# Patient Record
Sex: Male | Born: 1990 | Race: White | Hispanic: Yes | Marital: Single | Smoking: Current every day smoker
Health system: Southern US, Community
[De-identification: ages and names within clinical notes are randomized; demographics above are authoritative.]

## PROBLEM LIST (undated history)

## (undated) DIAGNOSIS — F419 Anxiety disorder, unspecified: Secondary | ICD-10-CM

---
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
## 2010-09-10 ENCOUNTER — Emergency Department (HOSPITAL_COMMUNITY): Admission: EM | Admit: 2010-09-10 | Discharge: 2010-07-11 | Payer: Self-pay | Admitting: Emergency Medicine

## 2017-01-23 ENCOUNTER — Inpatient Hospital Stay (HOSPITAL_COMMUNITY)
Admission: EM | Admit: 2017-01-23 | Discharge: 2017-01-25 | DRG: 958 | Disposition: A | Discharge status: Home / Self Care | Payer: Self-pay | Attending: General Surgery | Admitting: General Surgery

## 2017-01-23 ENCOUNTER — Encounter (HOSPITAL_COMMUNITY): Payer: Self-pay | Admitting: Emergency Medicine

## 2017-01-23 ENCOUNTER — Encounter (HOSPITAL_COMMUNITY): Admission: EM | Disposition: A | Discharge status: Home / Self Care | Payer: Self-pay | Source: Home / Self Care

## 2017-01-23 ENCOUNTER — Inpatient Hospital Stay (HOSPITAL_COMMUNITY): Payer: Self-pay | Admitting: Anesthesiology

## 2017-01-23 ENCOUNTER — Inpatient Hospital Stay (HOSPITAL_COMMUNITY): Payer: Self-pay

## 2017-01-23 ENCOUNTER — Emergency Department (HOSPITAL_COMMUNITY): Payer: Self-pay

## 2017-01-23 DIAGNOSIS — S82201B Unspecified fracture of shaft of right tibia, initial encounter for open fracture type I or II: Secondary | ICD-10-CM

## 2017-01-23 DIAGNOSIS — S82251B Displaced comminuted fracture of shaft of right tibia, initial encounter for open fracture type I or II: Principal | ICD-10-CM | POA: Diagnosis present

## 2017-01-23 DIAGNOSIS — S52201A Unspecified fracture of shaft of right ulna, initial encounter for closed fracture: Secondary | ICD-10-CM | POA: Diagnosis present

## 2017-01-23 DIAGNOSIS — Y92410 Unspecified street and highway as the place of occurrence of the external cause: Secondary | ICD-10-CM

## 2017-01-23 DIAGNOSIS — S301XXA Contusion of abdominal wall, initial encounter: Secondary | ICD-10-CM | POA: Diagnosis present

## 2017-01-23 DIAGNOSIS — S82401B Unspecified fracture of shaft of right fibula, initial encounter for open fracture type I or II: Secondary | ICD-10-CM | POA: Diagnosis present

## 2017-01-23 DIAGNOSIS — T148XXA Other injury of unspecified body region, initial encounter: Secondary | ICD-10-CM

## 2017-01-23 DIAGNOSIS — F17210 Nicotine dependence, cigarettes, uncomplicated: Secondary | ICD-10-CM | POA: Diagnosis present

## 2017-01-23 DIAGNOSIS — Y9301 Activity, walking, marching and hiking: Secondary | ICD-10-CM | POA: Diagnosis present

## 2017-01-23 DIAGNOSIS — S52601A Unspecified fracture of lower end of right ulna, initial encounter for closed fracture: Secondary | ICD-10-CM | POA: Diagnosis present

## 2017-01-23 DIAGNOSIS — S27321A Contusion of lung, unilateral, initial encounter: Secondary | ICD-10-CM | POA: Diagnosis present

## 2017-01-23 HISTORY — PX: ORIF ULNAR FRACTURE: SHX5417

## 2017-01-23 HISTORY — DX: Anxiety disorder, unspecified: F41.9

## 2017-01-23 HISTORY — PX: TIBIA IM NAIL INSERTION: SHX2516

## 2017-01-23 LAB — URINALYSIS, ROUTINE W REFLEX MICROSCOPIC
BACTERIA UA: NONE SEEN
Bilirubin Urine: NEGATIVE
GLUCOSE, UA: NEGATIVE mg/dL
KETONES UR: NEGATIVE mg/dL
Leukocytes, UA: NEGATIVE
Nitrite: NEGATIVE
PROTEIN: 30 mg/dL — AB
Specific Gravity, Urine: 1.019 (ref 1.005–1.030)
pH: 5 (ref 5.0–8.0)

## 2017-01-23 LAB — COMPREHENSIVE METABOLIC PANEL
ALT: 27 U/L (ref 17–63)
ANION GAP: 15 (ref 5–15)
AST: 44 U/L — ABNORMAL HIGH (ref 15–41)
Albumin: 3.8 g/dL (ref 3.5–5.0)
Alkaline Phosphatase: 64 U/L (ref 38–126)
BUN: 17 mg/dL (ref 6–20)
CO2: 21 mmol/L — AB (ref 22–32)
Calcium: 9.1 mg/dL (ref 8.9–10.3)
Chloride: 100 mmol/L — ABNORMAL LOW (ref 101–111)
Creatinine, Ser: 0.95 mg/dL (ref 0.61–1.24)
GFR calc Af Amer: >60 mL/min (ref >60)
GFR calc non Af Amer: >60 mL/min (ref >60)
GLUCOSE: 88 mg/dL (ref 65–99)
POTASSIUM: 4 mmol/L (ref 3.5–5.1)
SODIUM: 136 mmol/L (ref 135–145)
TOTAL PROTEIN: 6 g/dL — AB (ref 6.5–8.1)
Total Bilirubin: 0.7 mg/dL (ref 0.3–1.2)

## 2017-01-23 LAB — I-STAT CHEM 8, ED
BUN: 24 mg/dL — ABNORMAL HIGH (ref 6–20)
Calcium, Ion: 1.06 mmol/L — ABNORMAL LOW (ref 1.15–1.40)
Chloride: 101 mmol/L (ref 101–111)
Creatinine, Ser: 1.1 mg/dL (ref 0.61–1.24)
Glucose, Bld: 93 mg/dL (ref 65–99)
HCT: 42 % (ref 39.0–52.0)
Hemoglobin: 14.3 g/dL (ref 13.0–17.0)
Potassium: 4 mmol/L (ref 3.5–5.1)
SODIUM: 136 mmol/L (ref 135–145)
TCO2: 24 mmol/L (ref 0–100)

## 2017-01-23 LAB — I-STAT CG4 LACTIC ACID, ED: LACTIC ACID, VENOUS: 1.8 mmol/L (ref 0.5–1.9)

## 2017-01-23 LAB — BLOOD GAS, ARTERIAL
Acid-base deficit: 1.3 mmol/L (ref 0.0–2.0)
Bicarbonate: 25.1 mmol/L (ref 20.0–28.0)
Drawn by: 244851
FIO2: 100
O2 Saturation: 99.5 %
PCO2 ART: 60 mmHg — AB (ref 32.0–48.0)
PH ART: 7.245 — AB (ref 7.350–7.450)
Patient temperature: 98.6
pO2, Arterial: 285 mmHg — ABNORMAL HIGH (ref 83.0–108.0)

## 2017-01-23 LAB — RAPID URINE DRUG SCREEN, HOSP PERFORMED
Amphetamines: NOT DETECTED
BARBITURATES: NOT DETECTED
Benzodiazepines: POSITIVE — AB
COCAINE: POSITIVE — AB
OPIATES: POSITIVE — AB
TETRAHYDROCANNABINOL: NOT DETECTED

## 2017-01-23 LAB — CBC
HEMATOCRIT: 41.1 % (ref 39.0–52.0)
Hemoglobin: 14.1 g/dL (ref 13.0–17.0)
MCH: 31.4 pg (ref 26.0–34.0)
MCHC: 34.3 g/dL (ref 30.0–36.0)
MCV: 91.5 fL (ref 78.0–100.0)
Platelets: 253 10*3/uL (ref 150–400)
RBC: 4.49 MIL/uL (ref 4.22–5.81)
RDW: 14.1 % (ref 11.5–15.5)
WBC: 10.8 10*3/uL — ABNORMAL HIGH (ref 4.0–10.5)

## 2017-01-23 LAB — ETHANOL: Alcohol, Ethyl (B): <5 mg/dL (ref <5)

## 2017-01-23 LAB — HIV ANTIBODY (ROUTINE TESTING W REFLEX): HIV SCREEN 4TH GENERATION: NONREACTIVE

## 2017-01-23 LAB — SAMPLE TO BLOOD BANK

## 2017-01-23 LAB — CDS SEROLOGY

## 2017-01-23 LAB — PROTIME-INR
INR: 1.09
Prothrombin Time: 14.2 seconds (ref 11.4–15.2)

## 2017-01-23 LAB — MRSA PCR SCREENING: MRSA by PCR: NEGATIVE

## 2017-01-23 SURGERY — INSERTION, INTRAMEDULLARY ROD, TIBIA
Anesthesia: General | Site: Leg Lower | Laterality: Right

## 2017-01-23 MED ORDER — DEXTROSE 5 % IV SOLN
500.0000 mg | Freq: Four times a day (QID) | INTRAVENOUS | Status: DC | PRN
  Filled 2017-01-23: qty 5

## 2017-01-23 MED ORDER — ONDANSETRON HCL 4 MG/2ML IJ SOLN: 4.0000 mg | Freq: Four times a day (QID) | INTRAMUSCULAR | Status: DC | PRN

## 2017-01-23 MED ORDER — CEFAZOLIN SODIUM-DEXTROSE 2-4 GM/100ML-% IV SOLN
2.0000 g | Freq: Once | INTRAVENOUS | Status: AC
  Administered 2017-01-23: 2 g via INTRAVENOUS
  Filled 2017-01-23: qty 100

## 2017-01-23 MED ORDER — ADULT MULTIVITAMIN W/MINERALS CH
1.0000 | ORAL_TABLET | Freq: Every day | ORAL | Status: DC
  Administered 2017-01-24 – 2017-01-25 (×2): 1 via ORAL
  Filled 2017-01-23 (×3): qty 1

## 2017-01-23 MED ORDER — ACETAMINOPHEN 325 MG PO TABS
650.0000 mg | ORAL_TABLET | Freq: Four times a day (QID) | ORAL | Status: DC | PRN
  Administered 2017-01-24 (×2): 650 mg via ORAL
  Filled 2017-01-23 (×2): qty 2

## 2017-01-23 MED ORDER — BUPIVACAINE HCL (PF) 0.5 % IJ SOLN
INTRAMUSCULAR | Status: AC
  Filled 2017-01-23: qty 30

## 2017-01-23 MED ORDER — PROMETHAZINE HCL 25 MG/ML IJ SOLN: 6.2500 mg | INTRAMUSCULAR | Status: DC | PRN

## 2017-01-23 MED ORDER — LIDOCAINE 2% (20 MG/ML) 5 ML SYRINGE
INTRAMUSCULAR | Status: AC
  Filled 2017-01-23: qty 5

## 2017-01-23 MED ORDER — SUGAMMADEX SODIUM 200 MG/2ML IV SOLN
INTRAVENOUS | Status: DC | PRN
  Administered 2017-01-23: 200 mg via INTRAVENOUS

## 2017-01-23 MED ORDER — DEXAMETHASONE SODIUM PHOSPHATE 10 MG/ML IJ SOLN
INTRAMUSCULAR | Status: AC
  Filled 2017-01-23: qty 1

## 2017-01-23 MED ORDER — PROPOFOL 10 MG/ML IV BOLUS
INTRAVENOUS | Status: DC | PRN
  Administered 2017-01-23: 200 mg via INTRAVENOUS

## 2017-01-23 MED ORDER — MIDAZOLAM HCL 2 MG/2ML IJ SOLN
INTRAMUSCULAR | Status: AC
  Filled 2017-01-23: qty 2

## 2017-01-23 MED ORDER — CEFAZOLIN SODIUM 1 G IJ SOLR
INTRAMUSCULAR | Status: AC
  Filled 2017-01-23: qty 20

## 2017-01-23 MED ORDER — KETOROLAC TROMETHAMINE 30 MG/ML IJ SOLN
30.0000 mg | Freq: Four times a day (QID) | INTRAMUSCULAR | Status: DC
  Administered 2017-01-23 – 2017-01-25 (×8): 30 mg via INTRAVENOUS
  Filled 2017-01-23 (×8): qty 1

## 2017-01-23 MED ORDER — FENTANYL CITRATE (PF) 100 MCG/2ML IJ SOLN
50.0000 ug | Freq: Once | INTRAMUSCULAR | Status: AC
  Administered 2017-01-23: 50 ug via INTRAVENOUS
  Filled 2017-01-23: qty 2

## 2017-01-23 MED ORDER — ROCURONIUM BROMIDE 100 MG/10ML IV SOLN
INTRAVENOUS | Status: DC | PRN
  Administered 2017-01-23: 60 mg via INTRAVENOUS
  Administered 2017-01-23 (×2): 20 mg via INTRAVENOUS

## 2017-01-23 MED ORDER — SODIUM CHLORIDE 0.9 % IV SOLN
INTRAVENOUS | Status: DC
  Administered 2017-01-23 (×2): via INTRAVENOUS

## 2017-01-23 MED ORDER — ROCURONIUM BROMIDE 10 MG/ML (PF) SYRINGE
PREFILLED_SYRINGE | INTRAVENOUS | Status: AC
  Filled 2017-01-23: qty 5

## 2017-01-23 MED ORDER — METOCLOPRAMIDE HCL 5 MG PO TABS: 5.0000 mg | ORAL_TABLET | Freq: Three times a day (TID) | ORAL | Status: DC | PRN

## 2017-01-23 MED ORDER — FENTANYL CITRATE (PF) 100 MCG/2ML IJ SOLN
INTRAMUSCULAR | Status: AC | PRN
  Administered 2017-01-23: 50 ug via INTRAVENOUS

## 2017-01-23 MED ORDER — CEFAZOLIN SODIUM-DEXTROSE 2-3 GM-% IV SOLR
INTRAVENOUS | Status: DC | PRN
  Administered 2017-01-23: 2 g via INTRAVENOUS

## 2017-01-23 MED ORDER — SUGAMMADEX SODIUM 200 MG/2ML IV SOLN
INTRAVENOUS | Status: AC
  Filled 2017-01-23: qty 2

## 2017-01-23 MED ORDER — HYDROMORPHONE HCL 1 MG/ML IJ SOLN: 0.2500 mg | INTRAMUSCULAR | Status: DC | PRN

## 2017-01-23 MED ORDER — LIDOCAINE HCL (CARDIAC) 20 MG/ML IV SOLN
INTRAVENOUS | Status: DC | PRN
  Administered 2017-01-23: 100 mg via INTRAVENOUS

## 2017-01-23 MED ORDER — LORAZEPAM 1 MG PO TABS: 1.0000 mg | ORAL_TABLET | Freq: Four times a day (QID) | ORAL | Status: DC | PRN

## 2017-01-23 MED ORDER — 0.9 % SODIUM CHLORIDE (POUR BTL) OPTIME
TOPICAL | Status: DC | PRN
  Administered 2017-01-23: 1000 mL

## 2017-01-23 MED ORDER — ONDANSETRON HCL 4 MG/2ML IJ SOLN
INTRAMUSCULAR | Status: DC | PRN
  Administered 2017-01-23: 4 mg via INTRAVENOUS

## 2017-01-23 MED ORDER — ARTIFICIAL TEARS OP OINT
TOPICAL_OINTMENT | OPHTHALMIC | Status: DC | PRN
  Administered 2017-01-23: 1 via OPHTHALMIC

## 2017-01-23 MED ORDER — PROPOFOL 10 MG/ML IV BOLUS
INTRAVENOUS | Status: AC
  Filled 2017-01-23: qty 40

## 2017-01-23 MED ORDER — ONDANSETRON HCL 4 MG PO TABS: 4.0000 mg | ORAL_TABLET | Freq: Four times a day (QID) | ORAL | Status: DC | PRN

## 2017-01-23 MED ORDER — BUPIVACAINE HCL (PF) 0.25 % IJ SOLN
INTRAMUSCULAR | Status: AC
  Filled 2017-01-23: qty 30

## 2017-01-23 MED ORDER — DEXTROSE 5 % IV SOLN
INTRAVENOUS | Status: DC | PRN
  Administered 2017-01-23: 10:00:00 via INTRAVENOUS

## 2017-01-23 MED ORDER — FENTANYL CITRATE (PF) 100 MCG/2ML IJ SOLN
INTRAMUSCULAR | Status: DC | PRN
  Administered 2017-01-23 (×2): 50 ug via INTRAVENOUS
  Administered 2017-01-23: 100 ug via INTRAVENOUS
  Administered 2017-01-23: 50 ug via INTRAVENOUS

## 2017-01-23 MED ORDER — ARTIFICIAL TEARS OP OINT
TOPICAL_OINTMENT | OPHTHALMIC | Status: AC
  Filled 2017-01-23: qty 3.5

## 2017-01-23 MED ORDER — TETANUS-DIPHTH-ACELL PERTUSSIS 5-2.5-18.5 LF-MCG/0.5 IM SUSP
0.5000 mL | Freq: Once | INTRAMUSCULAR | Status: AC
  Administered 2017-01-23: 0.5 mL via INTRAMUSCULAR
  Filled 2017-01-23: qty 0.5

## 2017-01-23 MED ORDER — SODIUM CHLORIDE 0.9 % IV BOLUS (SEPSIS)
1000.0000 mL | Freq: Once | INTRAVENOUS | Status: AC
  Administered 2017-01-23: 1000 mL via INTRAVENOUS

## 2017-01-23 MED ORDER — LACTATED RINGERS IV SOLN
INTRAVENOUS | Status: DC | PRN
  Administered 2017-01-23: 10:00:00 via INTRAVENOUS

## 2017-01-23 MED ORDER — CEFAZOLIN SODIUM-DEXTROSE 2-4 GM/100ML-% IV SOLN
2.0000 g | Freq: Four times a day (QID) | INTRAVENOUS | Status: DC
  Administered 2017-01-23 – 2017-01-24 (×2): 2 g via INTRAVENOUS
  Filled 2017-01-23 (×2): qty 100

## 2017-01-23 MED ORDER — LORAZEPAM 1 MG PO TABS
0.0000 mg | ORAL_TABLET | Freq: Four times a day (QID) | ORAL | Status: AC
  Administered 2017-01-23: 4 mg via ORAL
  Filled 2017-01-23 (×2): qty 4

## 2017-01-23 MED ORDER — FOLIC ACID 1 MG PO TABS
1.0000 mg | ORAL_TABLET | Freq: Every day | ORAL | Status: DC
  Administered 2017-01-24 – 2017-01-25 (×2): 1 mg via ORAL
  Filled 2017-01-23 (×3): qty 1

## 2017-01-23 MED ORDER — IOPAMIDOL (ISOVUE-300) INJECTION 61%
INTRAVENOUS | Status: AC
  Administered 2017-01-23: 100 mL
  Filled 2017-01-23: qty 100

## 2017-01-23 MED ORDER — HYDROMORPHONE HCL 1 MG/ML IJ SOLN
1.0000 mg | INTRAMUSCULAR | Status: DC | PRN
  Administered 2017-01-23 (×2): 1 mg via INTRAVENOUS
  Filled 2017-01-23 (×2): qty 1

## 2017-01-23 MED ORDER — CEFAZOLIN SODIUM-DEXTROSE 2-4 GM/100ML-% IV SOLN
INTRAVENOUS | Status: AC
  Administered 2017-01-23: 2 g via INTRAVENOUS
  Filled 2017-01-23: qty 100

## 2017-01-23 MED ORDER — FENTANYL CITRATE (PF) 100 MCG/2ML IJ SOLN
INTRAMUSCULAR | Status: AC
  Filled 2017-01-23: qty 2

## 2017-01-23 MED ORDER — METOCLOPRAMIDE HCL 5 MG/ML IJ SOLN: 5.0000 mg | Freq: Three times a day (TID) | INTRAMUSCULAR | Status: DC | PRN

## 2017-01-23 MED ORDER — LORAZEPAM 2 MG/ML IJ SOLN: 1.0000 mg | Freq: Four times a day (QID) | INTRAMUSCULAR | Status: DC | PRN

## 2017-01-23 MED ORDER — ACETAMINOPHEN 650 MG RE SUPP: 650.0000 mg | Freq: Four times a day (QID) | RECTAL | Status: DC | PRN

## 2017-01-23 MED ORDER — BUPIVACAINE HCL (PF) 0.5 % IJ SOLN
INTRAMUSCULAR | Status: DC | PRN
  Administered 2017-01-23: 10 mL

## 2017-01-23 MED ORDER — ENOXAPARIN SODIUM 40 MG/0.4ML ~~LOC~~ SOLN
40.0000 mg | SUBCUTANEOUS | Status: DC
  Administered 2017-01-24 – 2017-01-25 (×2): 40 mg via SUBCUTANEOUS
  Filled 2017-01-23 (×3): qty 0.4

## 2017-01-23 MED ORDER — DEXAMETHASONE SODIUM PHOSPHATE 10 MG/ML IJ SOLN
INTRAMUSCULAR | Status: DC | PRN
  Administered 2017-01-23: 10 mg via INTRAVENOUS

## 2017-01-23 MED ORDER — MIDAZOLAM HCL 5 MG/5ML IJ SOLN
INTRAMUSCULAR | Status: DC | PRN
  Administered 2017-01-23: 2 mg via INTRAVENOUS

## 2017-01-23 MED ORDER — FENTANYL CITRATE (PF) 250 MCG/5ML IJ SOLN
INTRAMUSCULAR | Status: AC
  Filled 2017-01-23: qty 5

## 2017-01-23 MED ORDER — METHOCARBAMOL 500 MG PO TABS: 500.0000 mg | ORAL_TABLET | Freq: Four times a day (QID) | ORAL | Status: DC | PRN

## 2017-01-23 MED ORDER — LORAZEPAM 1 MG PO TABS: 0.0000 mg | ORAL_TABLET | Freq: Two times a day (BID) | ORAL | Status: DC

## 2017-01-23 MED ORDER — ONDANSETRON HCL 4 MG/2ML IJ SOLN
INTRAMUSCULAR | Status: AC
  Filled 2017-01-23: qty 2

## 2017-01-23 SURGICAL SUPPLY — 77 items
BANDAGE ACE 4X5 VEL STRL LF (GAUZE/BANDAGES/DRESSINGS) ×8 IMPLANT
BANDAGE ACE 6X5 VEL STRL LF (GAUZE/BANDAGES/DRESSINGS) ×8 IMPLANT
BANDAGE ELASTIC 4 VELCRO ST LF (GAUZE/BANDAGES/DRESSINGS) ×4 IMPLANT
BANDAGE ELASTIC 6 VELCRO ST LF (GAUZE/BANDAGES/DRESSINGS) ×4 IMPLANT
BANDAGE ESMARK 6X9 LF (GAUZE/BANDAGES/DRESSINGS) ×2 IMPLANT
BIT DRILL 2.0 (BIT) ×4 IMPLANT
BIT DRILL AO GAMMA 4.2X130 (BIT) ×4 IMPLANT
BIT DRILL AO GAMMA 4.2X180 (BIT) ×4 IMPLANT
BIT DRILL AO GAMMA 4.2X340 (BIT) ×8 IMPLANT
BNDG COHESIVE 4X5 TAN STRL (GAUZE/BANDAGES/DRESSINGS) ×4 IMPLANT
BNDG ESMARK 6X9 LF (GAUZE/BANDAGES/DRESSINGS) ×4
BNDG GAUZE ELAST 4 BULKY (GAUZE/BANDAGES/DRESSINGS) ×4 IMPLANT
COVER MAYO STAND STRL (DRAPES) ×8 IMPLANT
COVER SURGICAL LIGHT HANDLE (MISCELLANEOUS) ×4 IMPLANT
CUFF TOURNIQUET SINGLE 34IN LL (TOURNIQUET CUFF) ×4 IMPLANT
DRAPE C-ARM 42X72 X-RAY (DRAPES) ×4 IMPLANT
DRAPE C-ARMOR (DRAPES) ×4 IMPLANT
DRAPE HALF SHEET 40X57 (DRAPES) ×4 IMPLANT
DRAPE IMP U-DRAPE 54X76 (DRAPES) ×4 IMPLANT
DRAPE POUCH INSTRU U-SHP 10X18 (DRAPES) ×4 IMPLANT
DRAPE U-SHAPE 47X51 STRL (DRAPES) ×4 IMPLANT
DRAPE UTILITY XL STRL (DRAPES) ×8 IMPLANT
DRESSING ADAPTIC 1/2  N-ADH (PACKING) ×4 IMPLANT
DRSG PAD ABDOMINAL 8X10 ST (GAUZE/BANDAGES/DRESSINGS) ×8 IMPLANT
DURAPREP 26ML APPLICATOR (WOUND CARE) ×4 IMPLANT
ELECT CAUTERY BLADE 6.4 (BLADE) ×4 IMPLANT
ELECT REM PT RETURN 9FT ADLT (ELECTROSURGICAL) ×4
ELECTRODE REM PT RTRN 9FT ADLT (ELECTROSURGICAL) ×2 IMPLANT
FACESHIELD WRAPAROUND (MASK) ×8 IMPLANT
GAUZE SPONGE 4X4 12PLY STRL (GAUZE/BANDAGES/DRESSINGS) ×8 IMPLANT
GAUZE SPONGE 4X4 12PLY STRL LF (GAUZE/BANDAGES/DRESSINGS) ×8 IMPLANT
GAUZE XEROFORM 1X8 LF (GAUZE/BANDAGES/DRESSINGS) ×4 IMPLANT
GAUZE XEROFORM 5X9 LF (GAUZE/BANDAGES/DRESSINGS) ×4 IMPLANT
GLOVE BIO SURGEON STRL SZ7.5 (GLOVE) ×4 IMPLANT
GLOVE BIOGEL PI IND STRL 8 (GLOVE) ×2 IMPLANT
GLOVE BIOGEL PI INDICATOR 8 (GLOVE) ×2
GOWN STRL REUS W/ TWL LRG LVL3 (GOWN DISPOSABLE) ×4 IMPLANT
GOWN STRL REUS W/ TWL XL LVL3 (GOWN DISPOSABLE) ×4 IMPLANT
GOWN STRL REUS W/TWL LRG LVL3 (GOWN DISPOSABLE) ×4
GOWN STRL REUS W/TWL XL LVL3 (GOWN DISPOSABLE) ×4
GUIDEROD T2 3X1000 (ROD) ×4 IMPLANT
GUIDEWIRE GAMMA (WIRE) ×12 IMPLANT
K-WIRE FIXATION 3X285 COATED (WIRE) ×8
KIT BASIN OR (CUSTOM PROCEDURE TRAY) ×4 IMPLANT
KWIRE FIXATION 3X285 COATED (WIRE) ×4 IMPLANT
MANIFOLD NEPTUNE II (INSTRUMENTS) ×4 IMPLANT
NAIL ELAS INSERT SLV SPI 8-11 (MISCELLANEOUS) ×4 IMPLANT
NAIL TIBIA STD 9X330MM (Nail) ×4 IMPLANT
NS IRRIG 1000ML POUR BTL (IV SOLUTION) ×4 IMPLANT
PACK TOTAL JOINT (CUSTOM PROCEDURE TRAY) ×4 IMPLANT
PACK UNIVERSAL I (CUSTOM PROCEDURE TRAY) ×8 IMPLANT
PAD CAST 4YDX4 CTTN HI CHSV (CAST SUPPLIES) ×4 IMPLANT
PADDING CAST COTTON 4X4 STRL (CAST SUPPLIES) ×4
PADDING CAST COTTON 6X4 STRL (CAST SUPPLIES) ×4 IMPLANT
PLATE COMP NARROW STRT 7H/90MM (Plate) ×4 IMPLANT
REAMER INTRAMEDULLARY 8MM 510 (MISCELLANEOUS) ×3 IMPLANT
SCREW BONE 2.7X16MM (Screw) ×4 IMPLANT
SCREW BONE 2.7X18MM (Screw) ×4 IMPLANT
SCREW LOCKING 2.7X14MM (Screw) ×8 IMPLANT
SCREW LOCKING 2.7X16MM (Screw) ×8 IMPLANT
SCREW LOCKING T2 F/T  5MMX35MM (Screw) ×6 IMPLANT
SCREW LOCKING T2 F/T  5MMX65MM (Screw) ×2 IMPLANT
SCREW LOCKING T2 F/T  5X42.5MM (Screw) ×4 IMPLANT
SCREW LOCKING T2 F/T 5MMX35MM (Screw) ×6 IMPLANT
SCREW LOCKING T2 F/T 5MMX65MM (Screw) ×2 IMPLANT
SCREW LOCKING T2 F/T 5X42.5MM (Screw) ×4 IMPLANT
SPLINT PLASTER EXTRA FAST 3X15 (CAST SUPPLIES) ×2
SPLINT PLASTER GYPS XFAST 3X15 (CAST SUPPLIES) ×2 IMPLANT
STAPLER SKIN PROX WIDE 3.9 (STAPLE) ×4 IMPLANT
SUT ETHILON 3 0 PS 1 (SUTURE) ×8 IMPLANT
SUT MON AB 2-0 CT1 36 (SUTURE) ×4 IMPLANT
SUT VIC AB 0 CT1 27 (SUTURE) ×2
SUT VIC AB 0 CT1 27XBRD ANBCTR (SUTURE) ×2 IMPLANT
SUT VIC AB 2-0 FS1 27 (SUTURE) ×4 IMPLANT
SUT VICRYL RAPIDE 4/0 PS 2 (SUTURE) ×4 IMPLANT
TOWEL OR 17X26 10 PK STRL BLUE (TOWEL DISPOSABLE) ×16 IMPLANT
WATER STERILE IRR 1000ML POUR (IV SOLUTION) ×4 IMPLANT

## 2017-01-23 NOTE — Anesthesia Procedure Notes (Signed)
Procedure Name: Intubation Date/Time: 01/23/2017 9:46 AM Performed by: Jacquiline Doe A Pre-anesthesia Checklist: Patient identified, Emergency Drugs available, Suction available and Patient being monitored Patient Re-evaluated:Patient Re-evaluated prior to inductionOxygen Delivery Method: Circle System Utilized and Circle system utilized Preoxygenation: Pre-oxygenation with 100% oxygen Intubation Type: IV induction and Cricoid Pressure applied Ventilation: Mask ventilation without difficulty Laryngoscope Size: Mac and 4 Grade View: Grade I Tube type: Oral Tube size: 7.5 mm Number of attempts: 1 Airway Equipment and Method: Stylet Placement Confirmation: ETT inserted through vocal cords under direct vision,  positive ETCO2 and breath sounds checked- equal and bilateral Secured at: 23 cm Tube secured with: Tape Dental Injury: Teeth and Oropharynx as per pre-operative assessment
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
## 2017-01-23 NOTE — Progress Notes (Signed)
Orthopedic Tech Progress Note Patient Details:  James Perkins 02-08-1991 [id number]  Ortho Devices Type of Ortho Device: CAM walker Ortho Device/Splint Location: [place] Ortho Device/Splint Interventions: Application   [place] Pink 01/23/2017, 2:37 PM

## 2017-01-23 NOTE — ED Notes (Signed)
Pt has phone, passport, necklace at bedside

## 2017-01-23 NOTE — Transfer of Care (Signed)
Immediate Anesthesia Transfer of Care Note  Patient: James Perkins  Procedure(s) Performed: Procedure(s): INTRAMEDULLARY (IM) NAIL TIBIAL (Right) OPEN REDUCTION INTERNAL FIXATION (ORIF) ULNAR FRACTURE (Right)  Patient Location: PACU  Anesthesia Type:General  Level of Consciousness: awake, sedated, patient cooperative and responds to stimulation  Airway & Oxygen Therapy: Patient Spontanous Breathing and Patient connected to nasal cannula oxygen  Post-op Assessment: Report given to RN, Post -op Vital signs reviewed and stable, Patient moving all extremities and Patient moving all extremities X 4  Post vital signs: Reviewed and stable  Last Vitals:  Vitals:   01/23/17 0600 01/23/17 0626  BP: (!) 124/49 (!) 132/59  Pulse: 97 90  Resp: (!) 23   Temp:  37 C    Last Pain:  Vitals:   01/23/17 0703  TempSrc:   PainSc: 3          Complications: No apparent anesthesia complications
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
## 2017-01-23 NOTE — ED Notes (Signed)
Pt transported to CT ?

## 2017-01-23 NOTE — Progress Notes (Signed)
Orthopedic Tech Progress Note Patient Details:  James Perkins December 05, 1990 [id number]  Ortho Devices Type of Ortho Device: Ulna gutter splint, Post (short leg) splint Ortho Device/Splint Location: l[place] lue Ortho Device/Splint Interventions: Ordered, Application Applied splints as per drs verbal order.  Karolee Stamps 01/23/2017, 5:02 AM

## 2017-01-23 NOTE — Brief Op Note (Signed)
01/23/2017  12:00 PM  PATIENT:  James Perkins  26 y.o. male  PRE-OPERATIVE DIAGNOSIS:  Left Tibia-Fibula Fracture  POST-OPERATIVE DIAGNOSIS:  Left Tibia-Fibula Fracture  PROCEDURE:  Procedure(s): INTRAMEDULLARY (IM) NAIL TIBIAL (Right) OPEN REDUCTION INTERNAL FIXATION (ORIF) ULNAR FRACTURE (Right)  SURGEON:  Surgeon(s) and Role:    * Milly Jakob, MD    * Nicholes Stairs, MD - Primary  PHYSICIAN ASSISTANT:   ASSISTANTS: none   ANESTHESIA:   general  EBL:  Total I/O In: 1100 [I.V.:1100] Out: 200 [Blood:200]  BLOOD ADMINISTERED:none  DRAINS: none   LOCAL MEDICATIONS USED:  NONE  SPECIMEN:  No Specimen  DISPOSITION OF SPECIMEN:  N/A  COUNTS:  YES  TOURNIQUET:   Total Tourniquet Time Documented: Upper Arm (Right) - 53 minutes Total: Upper Arm (Right) - 53 minutes   DICTATION: .Note written in EPIC  PLAN OF CARE: Admit to inpatient   PATIENT DISPOSITION:  PACU - hemodynamically stable.   Delay start of Pharmacological VTE agent (>24hrs) due to surgical blood loss or risk of bleeding: not applicable
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
## 2017-01-23 NOTE — Progress Notes (Signed)
Found patient standing up next to bed in pre- operative area stating he wanted to go smoke. Requested a wheelchair. I explained to patient that smoking is not allowed anywhere on property and that if he leaves the property to smoke he would be checked out of hospital. Patient got back in bed apologized.

## 2017-01-23 NOTE — Anesthesia Postprocedure Evaluation (Signed)
Anesthesia Post Note  Patient: Delrico Minehart  Procedure(s) Performed: Procedure(s) (LRB): INTRAMEDULLARY (IM) NAIL TIBIAL (Right) OPEN REDUCTION INTERNAL FIXATION (ORIF) ULNAR FRACTURE (Right)  Patient location during evaluation: PACU Anesthesia Type: General Level of consciousness: awake and alert Pain management: pain level controlled Vital Signs Assessment: post-procedure vital signs reviewed and stable Respiratory status: spontaneous breathing, nonlabored ventilation, respiratory function stable and patient connected to nasal cannula oxygen Cardiovascular status: blood pressure returned to baseline and stable Postop Assessment: no signs of nausea or vomiting Anesthetic complications: no       Last Vitals:  Vitals:   01/23/17 1306 01/23/17 1313  BP: 132/75   Pulse: 72 73  Resp: 11 12  Temp:  36.7 C    Last Pain:  Vitals:   01/23/17 1350  TempSrc:   PainSc: 9                  Tiajuana Amass
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
## 2017-01-23 NOTE — Progress Notes (Signed)
All belongings given to family at bedside including money and cellphone

## 2017-01-23 NOTE — Significant Event (Signed)
Rapid Response Event Note  Overview:  Code Blue page Time Called: 5956 Arrival Time: 3875 Event Type: Other (Comment)  Initial Focused Assessment:  Code Blue page.  On my arrival to patients room, Rn and family at bedside.  As per Rn, patient was assisted to the bathroom and asked family for his belongings bag.  A while later RN was assisting patient back to bed and he became unresponsive.  Family found a powder substance in the bathroom that was rolled in his dollar bills and showed RN. Sats dropped to 58% and patient was apneic for few seconds.  Patient was ambued with 100% oxygen and then suddenly became agitated and combative and then placed on NRB.  He is talking, crying and very anxious.  HR 118-140's BP 126/55, RR 8-22   Interventions:  MD paged to room and patient transferred to ICU.  Patient states he snorted klonopin not cocaine.  Security called to bedside to assist.  Plan of Care (if not transferred):   Event Summary:   at      at          [facility], Harlin Rain

## 2017-01-23 NOTE — Discharge Instructions (Addendum)
RIGHT FOREARM INSTRUCTIONS:  Discharge Instructions   You have a dressing with a plaster splint incorporated in it. Move your fingers as much as possible, making a full fist and fully opening the fist. Elevate your hand to reduce pain & swelling of the digits.  Ice over the operative site may be helpful to reduce pain & swelling.  DO NOT USE HEAT. Leave the dressing in place until you return to our office.  You may shower, but keep the bandage clean & dry.  You may drive a car when you are off of prescription pain medications and can safely control your vehicle with both hands.   Please call [phone] during normal business hours or [phone] after hours for any problems. Including the following:  - excessive redness of the incisions - drainage for more than 4 days - fever of more than 101.5 F  *Please note that pain medications will not be refilled after hours or on weekends.   Right leg instructions  -ok to remove postoperative dressings in 3 days and replace with clean dry dressings -No weight bearing to the right leg -use lovenox 40 mg once daily for DVT prevention -do not get right leg incisions wet until seen by MD for follow up
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
## 2017-01-23 NOTE — Op Note (Signed)
01/23/2017  11:12 AM  PATIENT:  James Perkins  26 y.o. male  PRE-OPERATIVE DIAGNOSIS:  Closed displaced right distal ulna diaphyseal fracture  POST-OPERATIVE DIAGNOSIS:  Same  PROCEDURE:  ORIF right distal ulna shaft fracture  SURGEON: Rayvon Char. Grandville Silos, MD  PHYSICIAN ASSISTANT: None  ANESTHESIA:  general  SPECIMENS:  None  DRAINS:   None  EBL:  less than 50 mL  PREOPERATIVE INDICATIONS:  [name] is a  26 y.o. male with a closed displaced right distal ulna fracture  The risks benefits and alternatives were discussed with the patient preoperatively including but not limited to the risks of infection, bleeding, nerve injury, cardiopulmonary complications, the need for revision surgery, among others, and the patient verbalized understanding and consented to proceed.  OPERATIVE IMPLANTS: Stryker 3.1 mm plate with combination of locking and nonlocking 2.7 mm screws  OPERATIVE PROCEDURE:  After receiving prophylactic antibiotics, the patient was escorted to the operative theatre and placed in a supine position.  General anesthesia was administered.  A surgical "time-out" was performed during which the planned procedure, proposed operative site, and the correct patient identity were compared to the operative consent and agreement confirmed by the circulating nurse according to current facility policy.  Following application of a tourniquet to the operative right upper extremity, the exposed skin was pre-scrubbed with a Betadine scrub brush before being formally prepped with Chloraprep and draped in the usual sterile fashion.  The limb was exsanguinated with an Esmarch bandage and the tourniquet inflated to approximately 161mmHg higher than systolic BP.  A direct ulnar approach was made, coursing along opportunity areas of his body art to assist with more cosmetic result.  The skin was incised sharply with scalpel, subcutaneous tissues dissected with combination of  locked, spreading, and sharp dissection.  The periosteum on the ulnar border of the ulna was incised and subperiosteal dissection performed to expose the ulnar and volar portions of the fracture.  It was judged to be too proximal for the distal ulna plates by [employer], and attention was shifted to the Stryker set.  A 7 hole plate was selected.  The fracture was provisionally reduced and held with a clamp and then the plate contoured to fit and applied.  He was applied first with 2 nonlocking compressing screws on either side fracture, followed by 2 additional locking screws on each side the fracture leaving the central hole unfilled.  Reduction was judged to be near-anatomic and confirmed through fluoroscopic evaluation with the mini C-arm.  The most distal screw was intentionally shortened so it did not perforate the articular surface of the distal ulna.  DRUJ was judged to be stable and smooth and fluid.  Final images were obtained.  The wound was irrigated and the subcutaneous tissues reapproximated over the plate with 2-0 Vicryl deep dermal buried sutures.  The skin was then reapproximated with a series of 4-0 Vicryl Rapide interrupted subcuticular sutures followed by benzoin and Steri-Strips.  A short arm splint dressing with a plaster component was applied and the tourniquet was released.  He remained in the operating room for completion of his surgery for his tibia fracture.  DISPOSITION: He'll return to the floor, allowed to weight-bear with his right upper extremity with use of a platform device but otherwise with minimal weightbearing.  RTC 10-15 days with new x-rays of right wrist out of splint and decision regarding removable immobilization versus short arm cast.

## 2017-01-23 NOTE — Progress Notes (Signed)
Call originally went out as a Code Blue for patient. Once I arrived to room, I was advised that the family stated" the patient used cocaine in the bathroom". When placed back in the bed by the nurse he became unresponsive. Family was advised that all of the patient's belongings needed to go home and his visitors would be restricted. Patient is very anxious and needs his support system.The patients mother and father did not want to visit the patient. 2 visitors will be allowed at a time, his cousin Thamas Jaegers and Abbie Sons. The patient is very apologetic and emotional. CDGGammon, MSN, RN-AC

## 2017-01-23 NOTE — Progress Notes (Signed)
Was notified by Alyse Low RN that there was a code blue in room 21, when got to room patient was breathing. Code blue was canceled.  Mickel Baas, patients nurse stated patient was unresponsive.  Stated patient went to the bathroom and did cocaine.  Family said they gave him his bag and did not know it was in his bag.  Rapid and code blue team came up to unit.  Trauma was notified. Patient is being transferred to ICU.

## 2017-01-24 MED ORDER — SODIUM CHLORIDE 0.9% FLUSH: 3.0000 mL | Freq: Two times a day (BID) | INTRAVENOUS | Status: DC

## 2017-01-24 MED ORDER — SODIUM CHLORIDE 0.9 % IV SOLN: 250.0000 mL | INTRAVENOUS | Status: DC | PRN

## 2017-01-24 MED ORDER — SODIUM CHLORIDE 0.9% FLUSH: 3.0000 mL | INTRAVENOUS | Status: DC | PRN

## 2017-01-24 MED ORDER — CEFAZOLIN IN D5W 1 GM/50ML IV SOLN
1.0000 g | Freq: Three times a day (TID) | INTRAVENOUS | Status: DC
  Administered 2017-01-24 – 2017-01-25 (×4): 1 g via INTRAVENOUS
  Filled 2017-01-24 (×5): qty 50
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
## 2017-01-24 NOTE — Progress Notes (Signed)
  PATIENT ID: James Perkins  MRN: [id number]  DOB/AGE:  [birth year] / 26 y.o.  1 Day Post-Op Procedure(s) (LRB): INTRAMEDULLARY (IM) NAIL TIBIAL (Right) OPEN REDUCTION INTERNAL FIXATION (ORIF) ULNAR FRACTURE (Right)  Subjective: Sleeping with sitter and Mom in room  Objective: Vital signs in last 24 hours: Temp:  [97.9 F (36.6 C)-98.7 F (37.1 C)] 98.2 F (36.8 C) (04/23 1420) Pulse Rate:  [58-90] 90 (04/23 1420) Resp:  [8-16] 16 (04/23 1420) BP: (100-123)/(43-100) 115/62 (04/23 1420) SpO2:  [95 %-100 %] 99 % (04/23 1420)  Intake/Output from previous day: 04/22 0701 - 04/23 0700 In: 2150 [I.V.:2050; IV Piggyback:100] Out: 1775 [Urine:975; Blood:800] Intake/Output this shift: Total I/O In: 590 [P.O.:240; I.V.:300; IV Piggyback:50] Out: 600 [Urine:600]   Recent Labs  01/23/17 0152 01/23/17 0200  HGB 14.1 14.3    Recent Labs  01/23/17 0152 01/23/17 0200  WBC 10.8*  --   RBC 4.49  --   HCT 41.1 42.0  PLT 253  --     Recent Labs  01/23/17 0152 01/23/17 0200  NA 136 136  K 4.0 4.0  CL 100* 101  CO2 21*  --   BUN 17 24*  CREATININE 0.95 1.10  GLUCOSE 88 93  CALCIUM 9.1  --     Recent Labs  01/23/17 0152  INR 1.09    Physical Exam: Dried hemorrhage on R UE postop splint-dressing.  Intact LT sens R/M/U distribution with intact motor to same.  Digits warm  Assessment/Plan: 4-22: ORIF R ulna fx   May WB thru elbow, encourage digital ROM ex Keep splint/dressing clean and dry until RTC F/u in 10-15 days (appt already made and written into f/u section of Epic)   Rayvon Char. Grandville Silos, [place], [place]  [address] Office: [phone] Mobile: [phone]  01/24/2017, 6:17 PM

## 2017-01-24 NOTE — Progress Notes (Signed)
PT Cancellation Note  Patient Details Name: James Perkins MRN: [id number] DOB: 04/13/1991   Cancelled Treatment:    Reason Eval/Treat Not Completed: Patient not medically ready, on strict bedrest orders. Will follow-up for PT evaluation when medically appropriate as time allows.  Enis Gash, SPT Office-[phone]  Mabeline Caras 01/24/2017, 8:08 AM

## 2017-01-24 NOTE — Progress Notes (Addendum)
1 Day Post-Op  Subjective: CC resting, good pain control, no nausea  Objective: Vital signs in last 24 hours: Temp:  [97.9 F (36.6 C)-98.1 F (36.7 C)] 98 F (36.7 C) (04/23 0400) Pulse Rate:  [58-123] 63 (04/23 0700) Resp:  [8-18] 12 (04/23 0700) BP: (100-151)/(47-100) 108/60 (04/23 0700) SpO2:  [92 %-100 %] 99 % (04/23 0700)    Intake/Output from previous day: 04/22 0701 - 04/23 0700 In: 2150 [I.V.:2050; IV Piggyback:100] Out: 1775 [Urine:975; Blood:800] Intake/Output this shift: No intake/output data recorded.  General appearance: cooperative Resp: clear to auscultation bilaterally Cardio: regular rate and rhythm GI: soft, non-tender; bowel sounds normal; no masses,  no organomegaly Extremities: ACE R forearm and R tibia, digits perfused  Lab Results: CBC   Recent Labs  01/23/17 0152 01/23/17 0200  WBC 10.8*  --   HGB 14.1 14.3  HCT 41.1 42.0  PLT 253  --    BMET  Recent Labs  01/23/17 0152 01/23/17 0200  NA 136 136  K 4.0 4.0  CL 100* 101  CO2 21*  --   GLUCOSE 88 93  BUN 17 24*  CREATININE 0.95 1.10  CALCIUM 9.1  --    PT/INR  Recent Labs  01/23/17 0152  LABPROT 14.2  INR 1.09    Anti-infectives: Anti-infectives    Start     Dose/Rate Route Frequency Ordered Stop   01/23/17 1230  ceFAZolin (ANCEF) IVPB 2g/100 mL premix     2 g 200 mL/hr over 30 Minutes Intravenous Every 6 hours 01/23/17 1215 01/24/17 0629   01/23/17 0230  ceFAZolin (ANCEF) IVPB 2g/100 mL premix     2 g 200 mL/hr over 30 Minutes Intravenous  Once 01/23/17 0215 01/23/17 0248      Assessment/Plan: s/p Procedure(s): INTRAMEDULLARY (IM) NAIL TIBIAL OPEN REDUCTION INTERNAL FIXATION (ORIF) ULNAR FRACTURE PHBC R Tib fib FX - S/P IM nail by Dr. Stann Mainland, NWB R ulna FX - S/P ORIF by Dr. Cline Cools, WB through elbow PSA - including in hospital, nurses restricting visitation, CSW eval, CIWA FEN - Advance diet and SL IV ID - Ancef total 72h for open FX VTE -  Lovenox Dispo - floor, PT/OT  LOS: 1 day    Georganna Skeans, MD, MPH, FACS Trauma: [phone] General Surgery: [phone]  4/23/2018Patient ID: James Perkins, male   DOB: Apr 29, 1991, 26 y.o.   MRN: [id number]

## 2017-01-24 NOTE — Progress Notes (Signed)
Pt admitted to the unit as a transfer from 4N. Pt A&O x4; IV intact and transfusing; VSS; pt and family oriented to the unit and room; fall/safety precaution and prevention education completed; RUE and RLE surgical dsgs remains clean, dry and intact with old drainage to hand dsg marked. Neuro intact and pt MAE x4. Pt in bed with call light within reach, bed alarm on and cousin Estill Bamberg at bedside. Visitation restriction reinforced and family agree with care team. Will closely monitor pt. Delia Heady RN

## 2017-01-24 NOTE — Progress Notes (Signed)
Pt placed on telemetry; verified with CCMD; second RN called to second verify. Pt sleeping comfortably in bed with mother at bedside. Will continue to monitor pt closely. Delia Heady RN

## 2017-01-25 ENCOUNTER — Encounter (HOSPITAL_COMMUNITY): Payer: Self-pay | Admitting: Orthopedic Surgery

## 2017-01-25 LAB — CBC
HEMATOCRIT: 26.1 % — AB (ref 39.0–52.0)
Hemoglobin: 8.7 g/dL — ABNORMAL LOW (ref 13.0–17.0)
MCH: 31.5 pg (ref 26.0–34.0)
MCHC: 33.3 g/dL (ref 30.0–36.0)
MCV: 94.6 fL (ref 78.0–100.0)
Platelets: 145 10*3/uL — ABNORMAL LOW (ref 150–400)
RBC: 2.76 MIL/uL — AB (ref 4.22–5.81)
RDW: 14.6 % (ref 11.5–15.5)
WBC: 6.6 10*3/uL (ref 4.0–10.5)

## 2017-01-25 LAB — BASIC METABOLIC PANEL
ANION GAP: 4 — AB (ref 5–15)
BUN: 9 mg/dL (ref 6–20)
CO2: 25 mmol/L (ref 22–32)
Calcium: 7.9 mg/dL — ABNORMAL LOW (ref 8.9–10.3)
Chloride: 110 mmol/L (ref 101–111)
Creatinine, Ser: 0.83 mg/dL (ref 0.61–1.24)
GFR calc Af Amer: >60 mL/min (ref >60)
GFR calc non Af Amer: >60 mL/min (ref >60)
GLUCOSE: 107 mg/dL — AB (ref 65–99)
POTASSIUM: 4.2 mmol/L (ref 3.5–5.1)
Sodium: 139 mmol/L (ref 135–145)

## 2017-01-25 MED ORDER — ACETAMINOPHEN 325 MG PO TABS: 650.0000 mg | ORAL_TABLET | Freq: Four times a day (QID) | ORAL | Status: AC | PRN

## 2017-01-25 MED ORDER — ENOXAPARIN SODIUM 40 MG/0.4ML ~~LOC~~ SOLN: 40.0000 mg | SUBCUTANEOUS | 0 refills | Status: AC

## 2017-01-25 MED ORDER — ADULT MULTIVITAMIN W/MINERALS CH: 1.0000 | ORAL_TABLET | Freq: Every day | ORAL | Status: AC

## 2017-01-25 MED ORDER — KETOROLAC TROMETHAMINE 10 MG PO TABS: 10.0000 mg | ORAL_TABLET | Freq: Four times a day (QID) | ORAL | 0 refills | Status: AC | PRN

## 2017-01-25 MED FILL — ENOXAPARIN 40 MG/0.4 ML SYR: 40 | 28 days supply | Qty: 11 | Fill #0

## 2017-01-25 MED FILL — KETOROLAC 10 MG TABLET: 10 | 5 days supply | Qty: 20 | Fill #0

## 2017-01-25 NOTE — Progress Notes (Signed)
Patient given discharge instruction and all questions answered.  Patient taught how to give self injections for lovenox.  Patient discharged via wheelchair with all belongings.
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
## 2017-01-25 NOTE — Progress Notes (Signed)
[facility] Progress Note  2 Days Post-Op  Subjective:  CC: right arm and leg pain  No acute events overnight. No fever or chills. No nausea or vomiting. No numbness or tingling.  Objective: Vital signs in last 24 hours: Temp:  [98.2 F (36.8 C)-99.2 F (37.3 C)] 99.1 F (37.3 C) (04/24 0700) Pulse Rate:  [75-100] 78 (04/24 0700) Resp:  [16] 16 (04/24 0700) BP: (106-124)/(43-62) 124/56 (04/24 0700) SpO2:  [99 %-100 %] 100 % (04/24 0700) Last BM Date: 01/23/17  Intake/Output from previous day: 04/23 0701 - 04/24 0700 In: 640 [P.O.:240; I.V.:300; IV Piggyback:100] Out: 1025 [Urine:1025] Intake/Output this shift: No intake/output data recorded.  PE: Gen:  Alert, NAD, pleasant, cooperative, well appearing, sleeping Card:  RRR, no M/G/R heard, 2 + DP pulse or RLE Pulm:  CTA, no W/R/R, effort normal Abd: Soft, NT/ND, +BS,  Skin: no rashes noted, warm and dry Extremities: ACE on right forearm and RLE, able to wiggles all fingers and toes and sensation intact, good cap refill  Lab Results:   Recent Labs  01/23/17 0152 01/23/17 0200 01/25/17 0521  WBC 10.8*  --  6.6  HGB 14.1 14.3 8.7*  HCT 41.1 42.0 26.1*  PLT 253  --  145*   BMET  Recent Labs  01/23/17 0152 01/23/17 0200 01/25/17 0521  NA 136 136 139  K 4.0 4.0 4.2  CL 100* 101 110  CO2 21*  --  25  GLUCOSE 88 93 107*  BUN 17 24* 9  CREATININE 0.95 1.10 0.83  CALCIUM 9.1  --  7.9*   PT/INR  Recent Labs  01/23/17 0152  LABPROT 14.2  INR 1.09   CMP     Component Value Date/Time   NA 139 01/25/2017 0521   K 4.2 01/25/2017 0521   CL 110 01/25/2017 0521   CO2 25 01/25/2017 0521   GLUCOSE 107 (H) 01/25/2017 0521   BUN 9 01/25/2017 0521   CREATININE 0.83 01/25/2017 0521   CALCIUM 7.9 (L) 01/25/2017 0521   PROT 6.0 (L) 01/23/2017 0152   ALBUMIN 3.8 01/23/2017 0152   AST 44 (H) 01/23/2017 0152   ALT 27 01/23/2017 0152   ALKPHOS 64 01/23/2017 0152   BILITOT 0.7 01/23/2017 0152   GFRNONAA >60 01/25/2017 0521   GFRAA >60 01/25/2017 0521   Lipase  No results found for: LIPASE     Studies/Results: Dg Tibia/fibula Right Port  Result Date: 01/23/2017 CLINICAL DATA:  26 year old male with a history of ORIF EXAM: PORTABLE RIGHT TIBIA AND FIBULA - 2 VIEW COMPARISON:  01/23/2017 FINDINGS: Interval surgical changes of open reduction internal fixation of right tibial shaft fracture. Status post antegrade intramedullary rod placement with 2 proximal and 2 distal interlocking screws, there is improved anatomic alignment with reduction at the fracture site. Again demonstrated is transverse fracture of the proximal fibular shaft, with improved alignment compared to the prior. Bone density at the tibial fracture site within the soft tissues, likely posttraumatic/ bone fragments. Associated soft tissue swelling of the calf. Incidental note made of eggs ostosis of the distal femur. IMPRESSION: Status post ORIF of known right tibial fracture with early surgical changes and improved alignment at the fracture site, as above. Improved alignment at the site of proximal fibular fracture. Electronically Signed   By: Corrie Mckusick D.O.   On: 01/23/2017 12:58    Anti-infectives: Anti-infectives    Start     Dose/Rate Route Frequency Ordered Stop   01/24/17 1000  ceFAZolin (ANCEF) IVPB  1 g/50 mL premix     1 g 100 mL/hr over 30 Minutes Intravenous Every 8 hours 01/24/17 0848 01/26/17 1359   01/23/17 1230  ceFAZolin (ANCEF) IVPB 2g/100 mL premix  Status:  Discontinued     2 g 200 mL/hr over 30 Minutes Intravenous Every 6 hours 01/23/17 1215 01/24/17 0848   01/23/17 0230  ceFAZolin (ANCEF) IVPB 2g/100 mL premix     2 g 200 mL/hr over 30 Minutes Intravenous  Once 01/23/17 0215 01/23/17 0248       Assessment/Plan s/p Procedure(s): INTRAMEDULLARY (IM) NAIL TIBIAL OPEN REDUCTION INTERNAL FIXATION (ORIF) ULNAR FRACTURE PHBC R Tib fib FX - S/P IM nail by Dr. Stann Mainland, NWB R ulna FX - S/P ORIF  by Dr. Cline Cools, WB through elbow PSA - including in hospital, nurses restricting visitation, CSW eval, CIWA  FEN - Advance diet and SL IV ID - Ancef total 72h for open FX VTE - Lovenox  Dispo - f/u with Dr. Stann Mainland in 2 weeks for suture removal and Lovenox 40 mg once daily for 4 weeks. F/U with Dr. Milly Jakob in 10-15 days.   Possible discharge home later today pending OT   LOS: 2 days    Kalman Drape , [facility] 01/25/2017, 11:39 AM Pager: [phone] Consults: [phone] Mon-Fri 7:00 am-4:30 pm Sat-Sun 7:00 am-11:30 am
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
## 2017-01-25 NOTE — Progress Notes (Signed)
Offered Pt a bath. Pt appeared to be sleep, however he was able to give an answer regarding a bath. Tech offered Pt a bath. Pt stated no.
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
## 2018-06-06 ENCOUNTER — Ambulatory Visit: Payer: Self-pay | Admitting: Family Medicine
